# Patient Record
Sex: Male | Born: 1985 | Race: White | Hispanic: No | Marital: Single | Smoking: Current every day smoker
Health system: Southern US, Community
[De-identification: ages and names within clinical notes are randomized; demographics above are authoritative.]

## PROBLEM LIST (undated history)

## (undated) HISTORY — PX: BACK SURGERY: SHX140

---
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
## 2011-10-23 ENCOUNTER — Encounter (HOSPITAL_COMMUNITY): Payer: Self-pay | Admitting: *Deleted

## 2011-10-23 ENCOUNTER — Emergency Department (HOSPITAL_COMMUNITY)
Admission: EM | Admit: 2011-10-23 | Discharge: 2011-10-23 | Disposition: A | Discharge status: Home / Self Care | Payer: Self-pay | Attending: Emergency Medicine | Admitting: Emergency Medicine

## 2011-10-23 DIAGNOSIS — R109 Unspecified abdominal pain: Secondary | ICD-10-CM | POA: Insufficient documentation

## 2011-10-23 NOTE — ED Notes (Signed)
No answer in waiting areas x 1 

## 2011-10-23 NOTE — ED Notes (Signed)
abd pain for 9 years, nausea ,no vomiting, no diarrhea.

## 2013-06-13 ENCOUNTER — Encounter (HOSPITAL_COMMUNITY): Payer: Self-pay | Admitting: Emergency Medicine

## 2013-06-13 ENCOUNTER — Emergency Department (HOSPITAL_COMMUNITY)
Admission: EM | Admit: 2013-06-13 | Discharge: 2013-06-14 | Disposition: A | Discharge status: Home / Self Care | Payer: BC Managed Care – PPO | Attending: Emergency Medicine | Admitting: Emergency Medicine

## 2013-06-13 DIAGNOSIS — F17200 Nicotine dependence, unspecified, uncomplicated: Secondary | ICD-10-CM | POA: Insufficient documentation

## 2013-06-13 DIAGNOSIS — K529 Noninfective gastroenteritis and colitis, unspecified: Secondary | ICD-10-CM

## 2013-06-13 DIAGNOSIS — K5289 Other specified noninfective gastroenteritis and colitis: Secondary | ICD-10-CM | POA: Insufficient documentation

## 2013-06-13 LAB — CBC WITH DIFFERENTIAL/PLATELET
Basophils Absolute: 0 10*3/uL (ref 0.0–0.1)
Basophils Relative: 1 % (ref 0–1)
Eosinophils Absolute: 0 10*3/uL (ref 0.0–0.7)
Eosinophils Relative: 1 % (ref 0–5)
HCT: 45.6 % (ref 39.0–52.0)
Hemoglobin: 15.4 g/dL (ref 13.0–17.0)
Lymphocytes Relative: 24 % (ref 12–46)
Lymphs Abs: 1.1 10*3/uL (ref 0.7–4.0)
MCH: 30 pg (ref 26.0–34.0)
MCHC: 33.8 g/dL (ref 30.0–36.0)
MCV: 88.9 fL (ref 78.0–100.0)
Monocytes Absolute: 0.5 10*3/uL (ref 0.1–1.0)
Monocytes Relative: 11 % (ref 3–12)
Neutro Abs: 2.8 10*3/uL (ref 1.7–7.7)
Neutrophils Relative %: 64 % (ref 43–77)
Platelets: 162 10*3/uL (ref 150–400)
RBC: 5.13 MIL/uL (ref 4.22–5.81)
RDW: 12.7 % (ref 11.5–15.5)
WBC: 4.3 10*3/uL (ref 4.0–10.5)

## 2013-06-13 LAB — COMPREHENSIVE METABOLIC PANEL
ALT: 16 U/L (ref 0–53)
AST: 19 U/L (ref 0–37)
Albumin: 3.8 g/dL (ref 3.5–5.2)
Alkaline Phosphatase: 59 U/L (ref 39–117)
BUN: 16 mg/dL (ref 6–23)
CO2: 29 mEq/L (ref 19–32)
Calcium: 9.2 mg/dL (ref 8.4–10.5)
Chloride: 98 mEq/L (ref 96–112)
Creatinine, Ser: 0.99 mg/dL (ref 0.50–1.35)
GFR calc Af Amer: >90 mL/min (ref >90)
GFR calc non Af Amer: >90 mL/min (ref >90)
Glucose, Bld: 96 mg/dL (ref 70–99)
Potassium: 3.7 mEq/L (ref 3.7–5.3)
Sodium: 140 mEq/L (ref 137–147)
Total Bilirubin: 0.3 mg/dL (ref 0.3–1.2)
Total Protein: 7.3 g/dL (ref 6.0–8.3)

## 2013-06-13 LAB — LIPASE, BLOOD: Lipase: 10 U/L — ABNORMAL LOW (ref 11–59)

## 2013-06-13 MED ORDER — PROMETHAZINE HCL 25 MG PO TABS: 25.0000 mg | ORAL_TABLET | Freq: Four times a day (QID) | ORAL | Status: AC | PRN

## 2013-06-13 MED ORDER — ONDANSETRON HCL 4 MG/2ML IJ SOLN: 4.0000 mg | Freq: Once | INTRAMUSCULAR | Status: DC

## 2013-06-13 NOTE — ED Provider Notes (Signed)
CSN: [id number]     Arrival date & time 06/13/13  1552 History   First MD Initiated Contact with Patient 06/13/13 1728     Chief Complaint  Patient presents with  . Abdominal Pain  . Emesis     (Consider location/radiation/quality/duration/timing/severity/associated sxs/prior Treatment) Patient is a 28 y.o. male presenting with vomiting. The history is provided by the patient (the pt complains of vomiting and diarhea for a couple days.  pt improved today).  Emesis Severity:  Moderate Timing:  Intermittent Quality:  Unable to specify Able to tolerate:  Solids Associated symptoms: diarrhea   Associated symptoms: no abdominal pain and no headaches     History reviewed. No pertinent past medical history. Past Surgical History  Procedure Laterality Date  . Back surgery     History reviewed. No pertinent family history. History  Substance Use Topics  . Smoking status: Current Every Day Smoker -- 0.50 packs/day    Types: Cigarettes  . Smokeless tobacco: Not on file  . Alcohol Use: No    Review of Systems  Constitutional: Negative for appetite change and fatigue.  HENT: Negative for congestion, ear discharge and sinus pressure.   Eyes: Negative for discharge.  Respiratory: Negative for cough.   Cardiovascular: Negative for chest pain.  Gastrointestinal: Positive for vomiting and diarrhea. Negative for abdominal pain.  Genitourinary: Negative for frequency and hematuria.  Musculoskeletal: Negative for back pain.  Skin: Negative for rash.  Neurological: Negative for seizures and headaches.  Psychiatric/Behavioral: Negative for hallucinations.      Allergies  Review of patient's allergies indicates no known allergies.  Home Medications   Current Outpatient Rx  Name  Route  Sig  Dispense  Refill  . promethazine (PHENERGAN) 25 MG tablet   Oral   Take 1 tablet (25 mg total) by mouth every 6 (six) hours as needed for nausea or vomiting.   15 tablet   0    BP 129/78   Pulse 80  Temp(Src) 97.8 F (36.6 C) (Oral)  Resp 12  Wt 150 lb (68.04 kg)  SpO2 98% Physical Exam  Constitutional: He is oriented to person, place, and time. He appears well-developed.  HENT:  Head: Normocephalic.  Eyes: Conjunctivae and EOM are normal. No scleral icterus.  Neck: Neck supple. No thyromegaly present.  Cardiovascular: Normal rate and regular rhythm.  Exam reveals no gallop and no friction rub.   No murmur heard. Pulmonary/Chest: No stridor. He has no wheezes. He has no rales. He exhibits no tenderness.  Abdominal: He exhibits no distension. There is no tenderness. There is no rebound.  Musculoskeletal: Normal range of motion. He exhibits no edema.  Lymphadenopathy:    He has no cervical adenopathy.  Neurological: He is oriented to person, place, and time. He exhibits normal muscle tone. Coordination normal.  Skin: No rash noted. No erythema.  Psychiatric: He has a normal mood and affect. His behavior is normal.    ED Course  Procedures (including critical care time) Labs Review Labs Reviewed  LIPASE, BLOOD - Abnormal; Notable for the following:    Lipase 10 (*)    All other components within normal limits  CBC WITH DIFFERENTIAL  COMPREHENSIVE METABOLIC PANEL   Imaging Review No results found.   EKG Interpretation None      MDM   Final diagnoses:  Gastroenteritis        Benny LennertJoseph L Damani Kelemen, MD 06/13/13 [phone]

## 2013-06-13 NOTE — Discharge Instructions (Signed)
Follow up with your md next week for recheck °

## 2013-06-13 NOTE — ED Notes (Signed)
Pt arrives with complaints of abd pain, back pain, N/V/D since Friday. Started after eating shrimp at [facility] on Friday. Rates pain in abd 3/10 at rest, with ambulation, pt states he feels dizzy and nauseous.

## 2018-11-22 DIAGNOSIS — G894 Chronic pain syndrome: Secondary | ICD-10-CM | POA: Diagnosis not present

## 2018-12-23 DIAGNOSIS — G894 Chronic pain syndrome: Secondary | ICD-10-CM | POA: Diagnosis not present

## 2019-01-27 DIAGNOSIS — G894 Chronic pain syndrome: Secondary | ICD-10-CM | POA: Diagnosis not present

## 2019-01-27 DIAGNOSIS — M549 Dorsalgia, unspecified: Secondary | ICD-10-CM | POA: Diagnosis not present

## 2019-02-24 DIAGNOSIS — Z681 Body mass index (BMI) 19 or less, adult: Secondary | ICD-10-CM | POA: Diagnosis not present

## 2019-02-24 DIAGNOSIS — G894 Chronic pain syndrome: Secondary | ICD-10-CM | POA: Diagnosis not present

## 2019-02-24 DIAGNOSIS — K0889 Other specified disorders of teeth and supporting structures: Secondary | ICD-10-CM | POA: Diagnosis not present

## 2019-03-29 DIAGNOSIS — Z681 Body mass index (BMI) 19 or less, adult: Secondary | ICD-10-CM | POA: Diagnosis not present

## 2019-03-29 DIAGNOSIS — G894 Chronic pain syndrome: Secondary | ICD-10-CM | POA: Diagnosis not present

## 2019-03-29 DIAGNOSIS — G5601 Carpal tunnel syndrome, right upper limb: Secondary | ICD-10-CM | POA: Diagnosis not present

## 2019-04-29 DIAGNOSIS — G894 Chronic pain syndrome: Secondary | ICD-10-CM | POA: Diagnosis not present

## 2019-05-12 DIAGNOSIS — H5213 Myopia, bilateral: Secondary | ICD-10-CM | POA: Diagnosis not present

## 2019-05-20 DIAGNOSIS — Z6825 Body mass index (BMI) 25.0-25.9, adult: Secondary | ICD-10-CM | POA: Diagnosis not present

## 2019-05-20 DIAGNOSIS — E663 Overweight: Secondary | ICD-10-CM | POA: Diagnosis not present

## 2019-05-20 DIAGNOSIS — G894 Chronic pain syndrome: Secondary | ICD-10-CM | POA: Diagnosis not present

## 2019-05-20 DIAGNOSIS — Z1389 Encounter for screening for other disorder: Secondary | ICD-10-CM | POA: Diagnosis not present

## 2019-07-22 DIAGNOSIS — G894 Chronic pain syndrome: Secondary | ICD-10-CM | POA: Diagnosis not present

## 2019-09-20 DIAGNOSIS — G894 Chronic pain syndrome: Secondary | ICD-10-CM | POA: Diagnosis not present

## 2019-10-11 DIAGNOSIS — Z6825 Body mass index (BMI) 25.0-25.9, adult: Secondary | ICD-10-CM | POA: Diagnosis not present

## 2019-10-11 DIAGNOSIS — E663 Overweight: Secondary | ICD-10-CM | POA: Diagnosis not present

## 2019-10-11 DIAGNOSIS — G894 Chronic pain syndrome: Secondary | ICD-10-CM | POA: Diagnosis not present

## 2019-12-21 DIAGNOSIS — G894 Chronic pain syndrome: Secondary | ICD-10-CM | POA: Diagnosis not present

## 2019-12-21 DIAGNOSIS — R7309 Other abnormal glucose: Secondary | ICD-10-CM | POA: Diagnosis not present

## 2019-12-21 DIAGNOSIS — R739 Hyperglycemia, unspecified: Secondary | ICD-10-CM | POA: Diagnosis not present

## 2019-12-21 DIAGNOSIS — Z79891 Long term (current) use of opiate analgesic: Secondary | ICD-10-CM | POA: Diagnosis not present

## 2019-12-21 DIAGNOSIS — E663 Overweight: Secondary | ICD-10-CM | POA: Diagnosis not present

## 2019-12-21 DIAGNOSIS — Z0000 Encounter for general adult medical examination without abnormal findings: Secondary | ICD-10-CM | POA: Diagnosis not present

## 2019-12-21 DIAGNOSIS — E7489 Other specified disorders of carbohydrate metabolism: Secondary | ICD-10-CM | POA: Diagnosis not present

## 2019-12-21 DIAGNOSIS — Z6825 Body mass index (BMI) 25.0-25.9, adult: Secondary | ICD-10-CM | POA: Diagnosis not present

## 2020-01-20 DIAGNOSIS — E7849 Other hyperlipidemia: Secondary | ICD-10-CM | POA: Diagnosis not present

## 2020-01-20 DIAGNOSIS — G894 Chronic pain syndrome: Secondary | ICD-10-CM | POA: Diagnosis not present

## 2020-02-27 DIAGNOSIS — G894 Chronic pain syndrome: Secondary | ICD-10-CM | POA: Diagnosis not present

## 2020-03-20 DIAGNOSIS — G894 Chronic pain syndrome: Secondary | ICD-10-CM | POA: Diagnosis not present

## 2020-03-20 DIAGNOSIS — E663 Overweight: Secondary | ICD-10-CM | POA: Diagnosis not present

## 2020-03-20 DIAGNOSIS — Z6826 Body mass index (BMI) 26.0-26.9, adult: Secondary | ICD-10-CM | POA: Diagnosis not present

## 2020-03-20 DIAGNOSIS — E7849 Other hyperlipidemia: Secondary | ICD-10-CM | POA: Diagnosis not present

## 2020-05-21 DIAGNOSIS — G894 Chronic pain syndrome: Secondary | ICD-10-CM | POA: Diagnosis not present

## 2020-06-19 DIAGNOSIS — G894 Chronic pain syndrome: Secondary | ICD-10-CM | POA: Diagnosis not present

## 2020-07-12 DIAGNOSIS — E663 Overweight: Secondary | ICD-10-CM | POA: Diagnosis not present

## 2020-07-12 DIAGNOSIS — G894 Chronic pain syndrome: Secondary | ICD-10-CM | POA: Diagnosis not present

## 2020-07-12 DIAGNOSIS — E7849 Other hyperlipidemia: Secondary | ICD-10-CM | POA: Diagnosis not present

## 2020-07-12 DIAGNOSIS — Z1331 Encounter for screening for depression: Secondary | ICD-10-CM | POA: Diagnosis not present

## 2020-07-12 DIAGNOSIS — Z6826 Body mass index (BMI) 26.0-26.9, adult: Secondary | ICD-10-CM | POA: Diagnosis not present

## 2020-08-17 DIAGNOSIS — G894 Chronic pain syndrome: Secondary | ICD-10-CM | POA: Diagnosis not present

## 2020-09-14 DIAGNOSIS — E782 Mixed hyperlipidemia: Secondary | ICD-10-CM | POA: Diagnosis not present

## 2020-09-14 DIAGNOSIS — G894 Chronic pain syndrome: Secondary | ICD-10-CM | POA: Diagnosis not present

## 2021-02-21 DIAGNOSIS — G894 Chronic pain syndrome: Secondary | ICD-10-CM | POA: Diagnosis not present

## 2021-02-21 DIAGNOSIS — M47816 Spondylosis without myelopathy or radiculopathy, lumbar region: Secondary | ICD-10-CM | POA: Diagnosis not present

## 2021-02-21 DIAGNOSIS — E782 Mixed hyperlipidemia: Secondary | ICD-10-CM | POA: Diagnosis not present

## 2021-04-11 DIAGNOSIS — Z6826 Body mass index (BMI) 26.0-26.9, adult: Secondary | ICD-10-CM | POA: Diagnosis not present

## 2021-04-11 DIAGNOSIS — M47816 Spondylosis without myelopathy or radiculopathy, lumbar region: Secondary | ICD-10-CM | POA: Diagnosis not present

## 2021-04-11 DIAGNOSIS — E663 Overweight: Secondary | ICD-10-CM | POA: Diagnosis not present

## 2021-04-11 DIAGNOSIS — G894 Chronic pain syndrome: Secondary | ICD-10-CM | POA: Diagnosis not present

## 2021-06-11 DIAGNOSIS — M47816 Spondylosis without myelopathy or radiculopathy, lumbar region: Secondary | ICD-10-CM | POA: Diagnosis not present

## 2021-06-11 DIAGNOSIS — G894 Chronic pain syndrome: Secondary | ICD-10-CM | POA: Diagnosis not present

## 2021-09-24 DIAGNOSIS — G894 Chronic pain syndrome: Secondary | ICD-10-CM | POA: Diagnosis not present

## 2021-10-11 DIAGNOSIS — E663 Overweight: Secondary | ICD-10-CM | POA: Diagnosis not present

## 2021-10-11 DIAGNOSIS — E782 Mixed hyperlipidemia: Secondary | ICD-10-CM | POA: Diagnosis not present

## 2021-10-11 DIAGNOSIS — M47816 Spondylosis without myelopathy or radiculopathy, lumbar region: Secondary | ICD-10-CM | POA: Diagnosis not present

## 2021-10-11 DIAGNOSIS — Z6825 Body mass index (BMI) 25.0-25.9, adult: Secondary | ICD-10-CM | POA: Diagnosis not present

## 2021-10-11 DIAGNOSIS — G894 Chronic pain syndrome: Secondary | ICD-10-CM | POA: Diagnosis not present

## 2021-12-23 DIAGNOSIS — G894 Chronic pain syndrome: Secondary | ICD-10-CM | POA: Diagnosis not present

## 2021-12-23 DIAGNOSIS — M47816 Spondylosis without myelopathy or radiculopathy, lumbar region: Secondary | ICD-10-CM | POA: Diagnosis not present

## 2022-02-24 DIAGNOSIS — E782 Mixed hyperlipidemia: Secondary | ICD-10-CM | POA: Diagnosis not present

## 2022-02-24 DIAGNOSIS — G894 Chronic pain syndrome: Secondary | ICD-10-CM | POA: Diagnosis not present

## 2022-02-24 DIAGNOSIS — M47816 Spondylosis without myelopathy or radiculopathy, lumbar region: Secondary | ICD-10-CM | POA: Diagnosis not present

## 2022-04-25 DIAGNOSIS — G894 Chronic pain syndrome: Secondary | ICD-10-CM | POA: Diagnosis not present

## 2022-04-25 DIAGNOSIS — Z6825 Body mass index (BMI) 25.0-25.9, adult: Secondary | ICD-10-CM | POA: Diagnosis not present

## 2022-05-13 DIAGNOSIS — M47816 Spondylosis without myelopathy or radiculopathy, lumbar region: Secondary | ICD-10-CM | POA: Diagnosis not present

## 2022-05-13 DIAGNOSIS — G894 Chronic pain syndrome: Secondary | ICD-10-CM | POA: Diagnosis not present

## 2022-05-13 DIAGNOSIS — J0100 Acute maxillary sinusitis, unspecified: Secondary | ICD-10-CM | POA: Diagnosis not present

## 2022-06-17 DIAGNOSIS — M47816 Spondylosis without myelopathy or radiculopathy, lumbar region: Secondary | ICD-10-CM | POA: Diagnosis not present

## 2022-06-17 DIAGNOSIS — G894 Chronic pain syndrome: Secondary | ICD-10-CM | POA: Diagnosis not present

## 2022-07-31 DIAGNOSIS — G894 Chronic pain syndrome: Secondary | ICD-10-CM | POA: Diagnosis not present

## 2022-07-31 DIAGNOSIS — M47816 Spondylosis without myelopathy or radiculopathy, lumbar region: Secondary | ICD-10-CM | POA: Diagnosis not present

## 2022-07-31 DIAGNOSIS — Z6824 Body mass index (BMI) 24.0-24.9, adult: Secondary | ICD-10-CM | POA: Diagnosis not present

## 2022-09-23 DIAGNOSIS — G894 Chronic pain syndrome: Secondary | ICD-10-CM | POA: Diagnosis not present

## 2022-10-27 DIAGNOSIS — G894 Chronic pain syndrome: Secondary | ICD-10-CM | POA: Diagnosis not present

## 2022-10-27 DIAGNOSIS — M47816 Spondylosis without myelopathy or radiculopathy, lumbar region: Secondary | ICD-10-CM | POA: Diagnosis not present

## 2022-12-23 DIAGNOSIS — E782 Mixed hyperlipidemia: Secondary | ICD-10-CM | POA: Diagnosis not present

## 2022-12-23 DIAGNOSIS — Z6825 Body mass index (BMI) 25.0-25.9, adult: Secondary | ICD-10-CM | POA: Diagnosis not present

## 2022-12-23 DIAGNOSIS — G894 Chronic pain syndrome: Secondary | ICD-10-CM | POA: Diagnosis not present

## 2022-12-23 DIAGNOSIS — M47816 Spondylosis without myelopathy or radiculopathy, lumbar region: Secondary | ICD-10-CM | POA: Diagnosis not present

## 2023-02-04 DIAGNOSIS — H5213 Myopia, bilateral: Secondary | ICD-10-CM | POA: Diagnosis not present

## 2023-02-20 DIAGNOSIS — S29011A Strain of muscle and tendon of front wall of thorax, initial encounter: Secondary | ICD-10-CM | POA: Diagnosis not present

## 2023-02-20 DIAGNOSIS — G894 Chronic pain syndrome: Secondary | ICD-10-CM | POA: Diagnosis not present

## 2023-02-20 DIAGNOSIS — M47816 Spondylosis without myelopathy or radiculopathy, lumbar region: Secondary | ICD-10-CM | POA: Diagnosis not present

## 2023-04-21 DIAGNOSIS — M47816 Spondylosis without myelopathy or radiculopathy, lumbar region: Secondary | ICD-10-CM | POA: Diagnosis not present

## 2023-04-21 DIAGNOSIS — G894 Chronic pain syndrome: Secondary | ICD-10-CM | POA: Diagnosis not present

## 2023-05-29 DIAGNOSIS — G894 Chronic pain syndrome: Secondary | ICD-10-CM | POA: Diagnosis not present

## 2023-05-29 DIAGNOSIS — Z6824 Body mass index (BMI) 24.0-24.9, adult: Secondary | ICD-10-CM | POA: Diagnosis not present

## 2023-06-25 DIAGNOSIS — M47816 Spondylosis without myelopathy or radiculopathy, lumbar region: Secondary | ICD-10-CM | POA: Diagnosis not present

## 2023-06-25 DIAGNOSIS — G894 Chronic pain syndrome: Secondary | ICD-10-CM | POA: Diagnosis not present

## 2023-06-25 DIAGNOSIS — M5412 Radiculopathy, cervical region: Secondary | ICD-10-CM | POA: Diagnosis not present

## 2023-08-21 DIAGNOSIS — M47816 Spondylosis without myelopathy or radiculopathy, lumbar region: Secondary | ICD-10-CM | POA: Diagnosis not present

## 2023-08-21 DIAGNOSIS — M5412 Radiculopathy, cervical region: Secondary | ICD-10-CM | POA: Diagnosis not present

## 2023-08-21 DIAGNOSIS — G894 Chronic pain syndrome: Secondary | ICD-10-CM | POA: Diagnosis not present

## 2023-10-06 DIAGNOSIS — G894 Chronic pain syndrome: Secondary | ICD-10-CM | POA: Diagnosis not present

## 2023-10-06 DIAGNOSIS — M5412 Radiculopathy, cervical region: Secondary | ICD-10-CM | POA: Diagnosis not present

## 2023-10-06 DIAGNOSIS — B070 Plantar wart: Secondary | ICD-10-CM | POA: Diagnosis not present

## 2023-10-06 DIAGNOSIS — E663 Overweight: Secondary | ICD-10-CM | POA: Diagnosis not present

## 2023-10-06 DIAGNOSIS — M47816 Spondylosis without myelopathy or radiculopathy, lumbar region: Secondary | ICD-10-CM | POA: Diagnosis not present

## 2023-10-06 DIAGNOSIS — Z6825 Body mass index (BMI) 25.0-25.9, adult: Secondary | ICD-10-CM | POA: Diagnosis not present

## 2024-01-07 DIAGNOSIS — M545 Low back pain, unspecified: Secondary | ICD-10-CM | POA: Diagnosis not present

## 2024-01-07 DIAGNOSIS — Z6826 Body mass index (BMI) 26.0-26.9, adult: Secondary | ICD-10-CM | POA: Diagnosis not present

## 2024-02-11 DIAGNOSIS — J069 Acute upper respiratory infection, unspecified: Secondary | ICD-10-CM | POA: Diagnosis not present
